# Patient Record
Sex: Female | Born: 1945 | Race: White | Hispanic: No | Marital: Married
Health system: Southern US, Community
[De-identification: ages and names within clinical notes are randomized; demographics above are authoritative.]

## PROBLEM LIST (undated history)

## (undated) DIAGNOSIS — G2111 Neuroleptic induced parkinsonism: Secondary | ICD-10-CM

## (undated) DIAGNOSIS — E039 Hypothyroidism, unspecified: Secondary | ICD-10-CM

## (undated) DIAGNOSIS — F201 Disorganized schizophrenia: Secondary | ICD-10-CM

## (undated) DIAGNOSIS — M810 Age-related osteoporosis without current pathological fracture: Secondary | ICD-10-CM

## (undated) DIAGNOSIS — F329 Major depressive disorder, single episode, unspecified: Secondary | ICD-10-CM

## (undated) DIAGNOSIS — G259 Extrapyramidal and movement disorder, unspecified: Secondary | ICD-10-CM

## (undated) DIAGNOSIS — R627 Adult failure to thrive: Secondary | ICD-10-CM

## (undated) DIAGNOSIS — I10 Essential (primary) hypertension: Secondary | ICD-10-CM

## (undated) DIAGNOSIS — F509 Eating disorder, unspecified: Secondary | ICD-10-CM

## (undated) DIAGNOSIS — F419 Anxiety disorder, unspecified: Secondary | ICD-10-CM

## (undated) DIAGNOSIS — K219 Gastro-esophageal reflux disease without esophagitis: Secondary | ICD-10-CM

## (undated) DIAGNOSIS — D649 Anemia, unspecified: Secondary | ICD-10-CM

## (undated) DIAGNOSIS — G4700 Insomnia, unspecified: Secondary | ICD-10-CM

## (undated) DIAGNOSIS — E785 Hyperlipidemia, unspecified: Secondary | ICD-10-CM

---

## 2005-11-23 ENCOUNTER — Inpatient Hospital Stay: Payer: Self-pay | Admitting: Internal Medicine

## 2005-11-23 ENCOUNTER — Other Ambulatory Visit: Payer: Self-pay

## 2005-12-26 ENCOUNTER — Other Ambulatory Visit: Payer: Self-pay

## 2005-12-26 ENCOUNTER — Inpatient Hospital Stay: Payer: Self-pay | Admitting: Internal Medicine

## 2007-09-30 ENCOUNTER — Other Ambulatory Visit: Payer: Self-pay

## 2007-09-30 ENCOUNTER — Observation Stay: Payer: Self-pay | Admitting: Internal Medicine

## 2008-02-16 IMAGING — CR DG LUMBAR SPINE 2-3V
1 series · 3 of 3 positions shown · non-contrast
Comparison: none

REASON FOR EXAM: Fall, back pain//Rm19
COMMENTS:

PROCEDURE:     DXR - DXR LUMBAR SPINE AP AND LATERAL  - November 23, 2005 [DATE]
RESULT:     The study demonstrates the loss of height within L2 suggestive
of a compression fracture which is indeterminate in age.  There is no
subluxation.  The alignment is otherwise unremarkable.

[Series 1: view not recorded · 0.17mm/px · 3 of 3 slices shown]
[im 1/3]
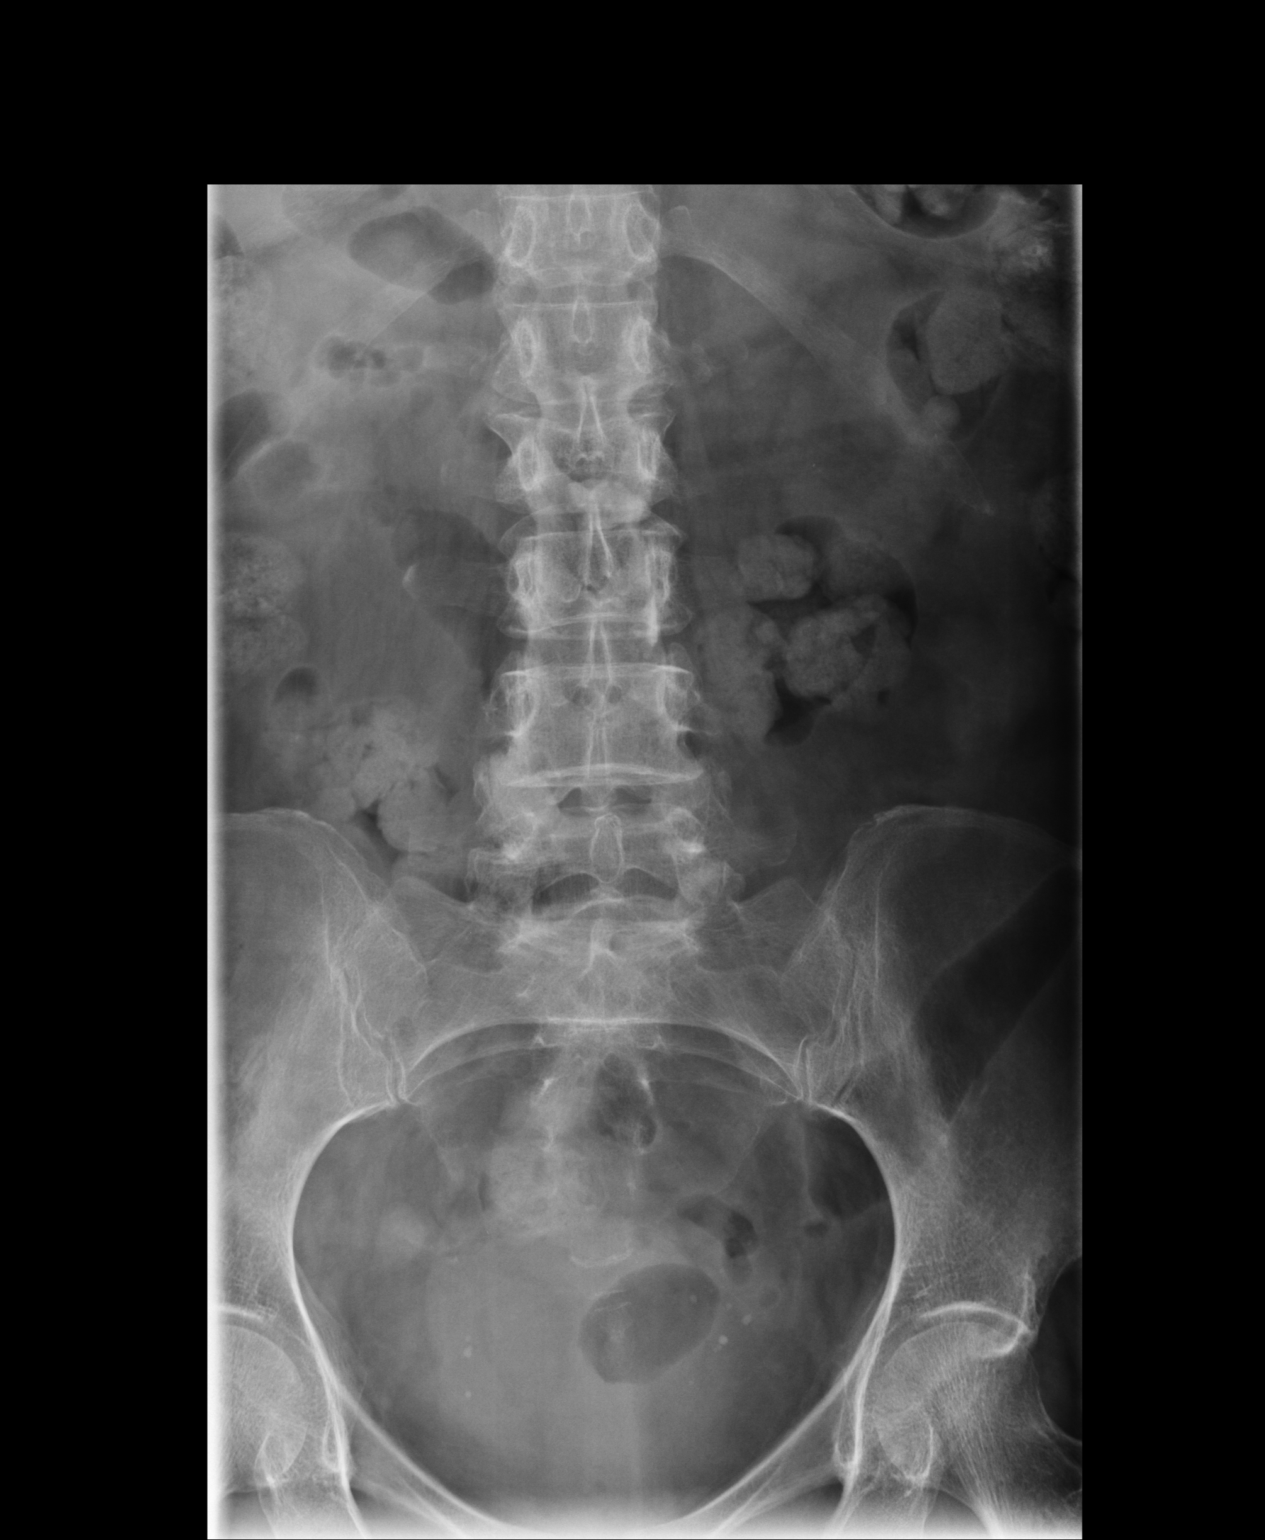
[im 2/3]
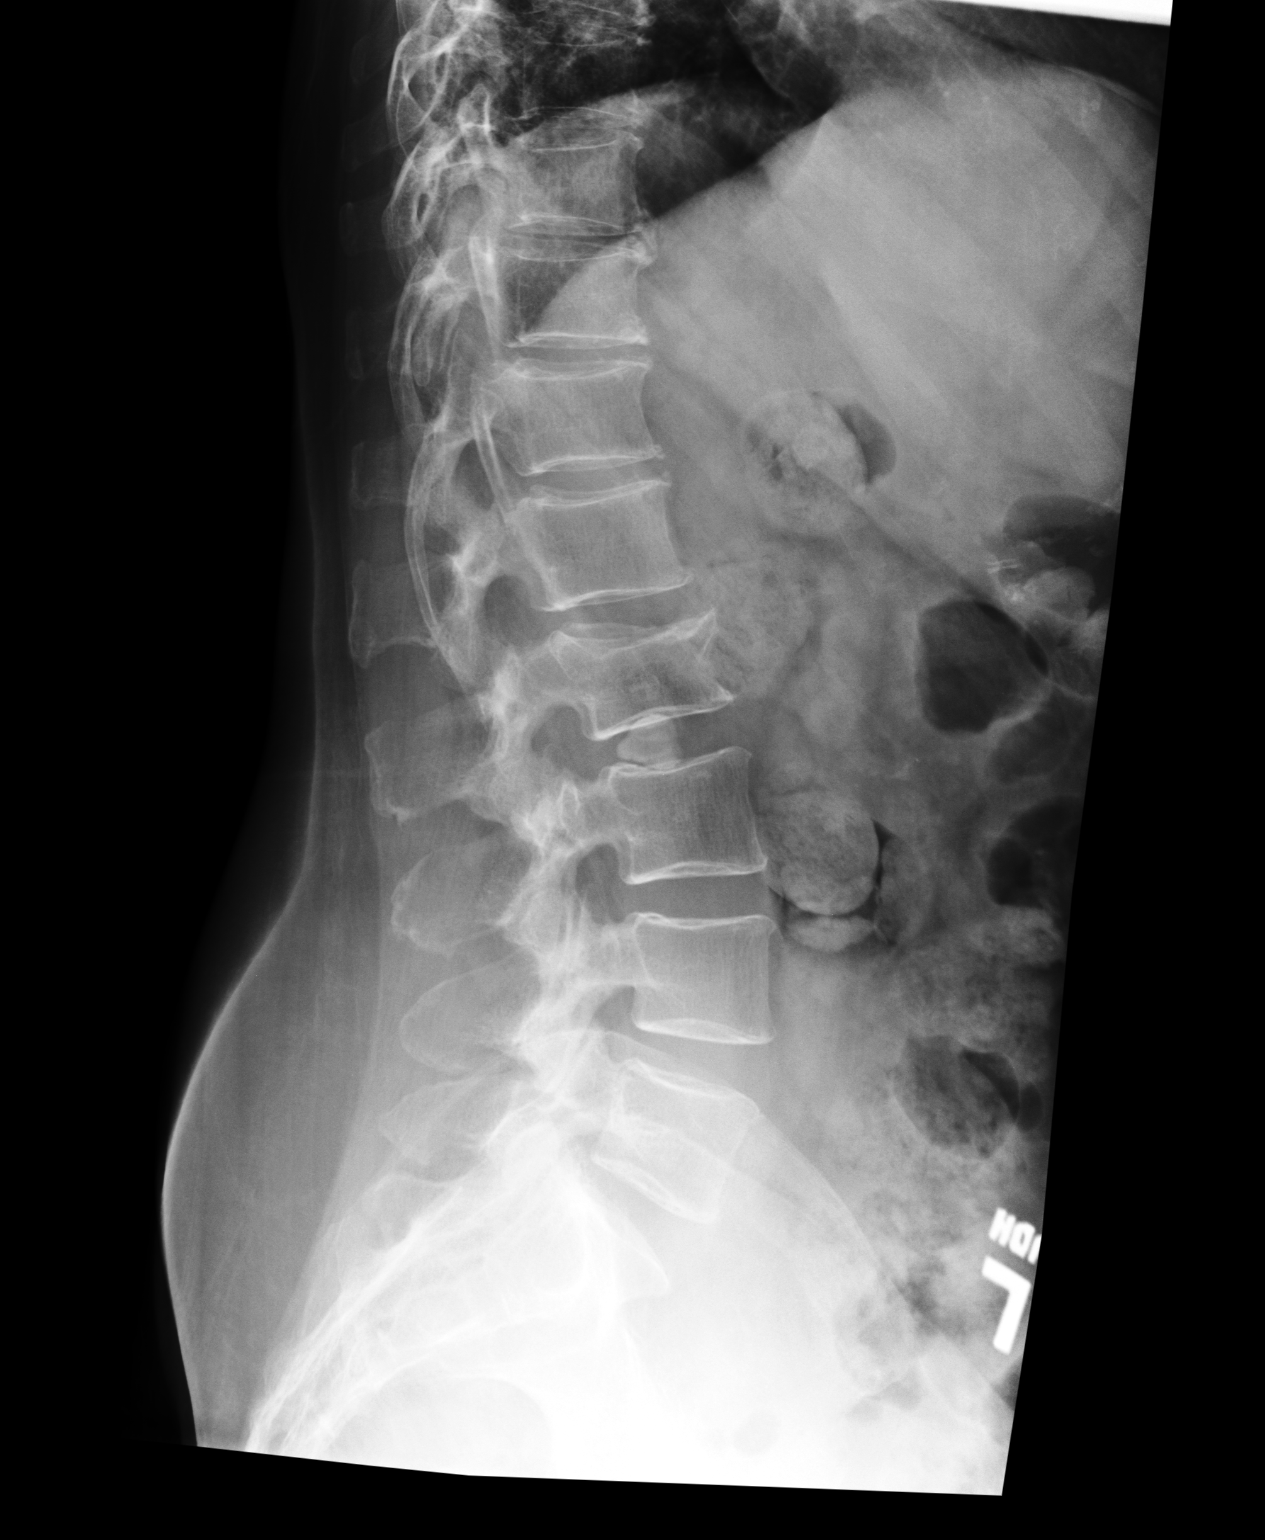
[im 3/3]
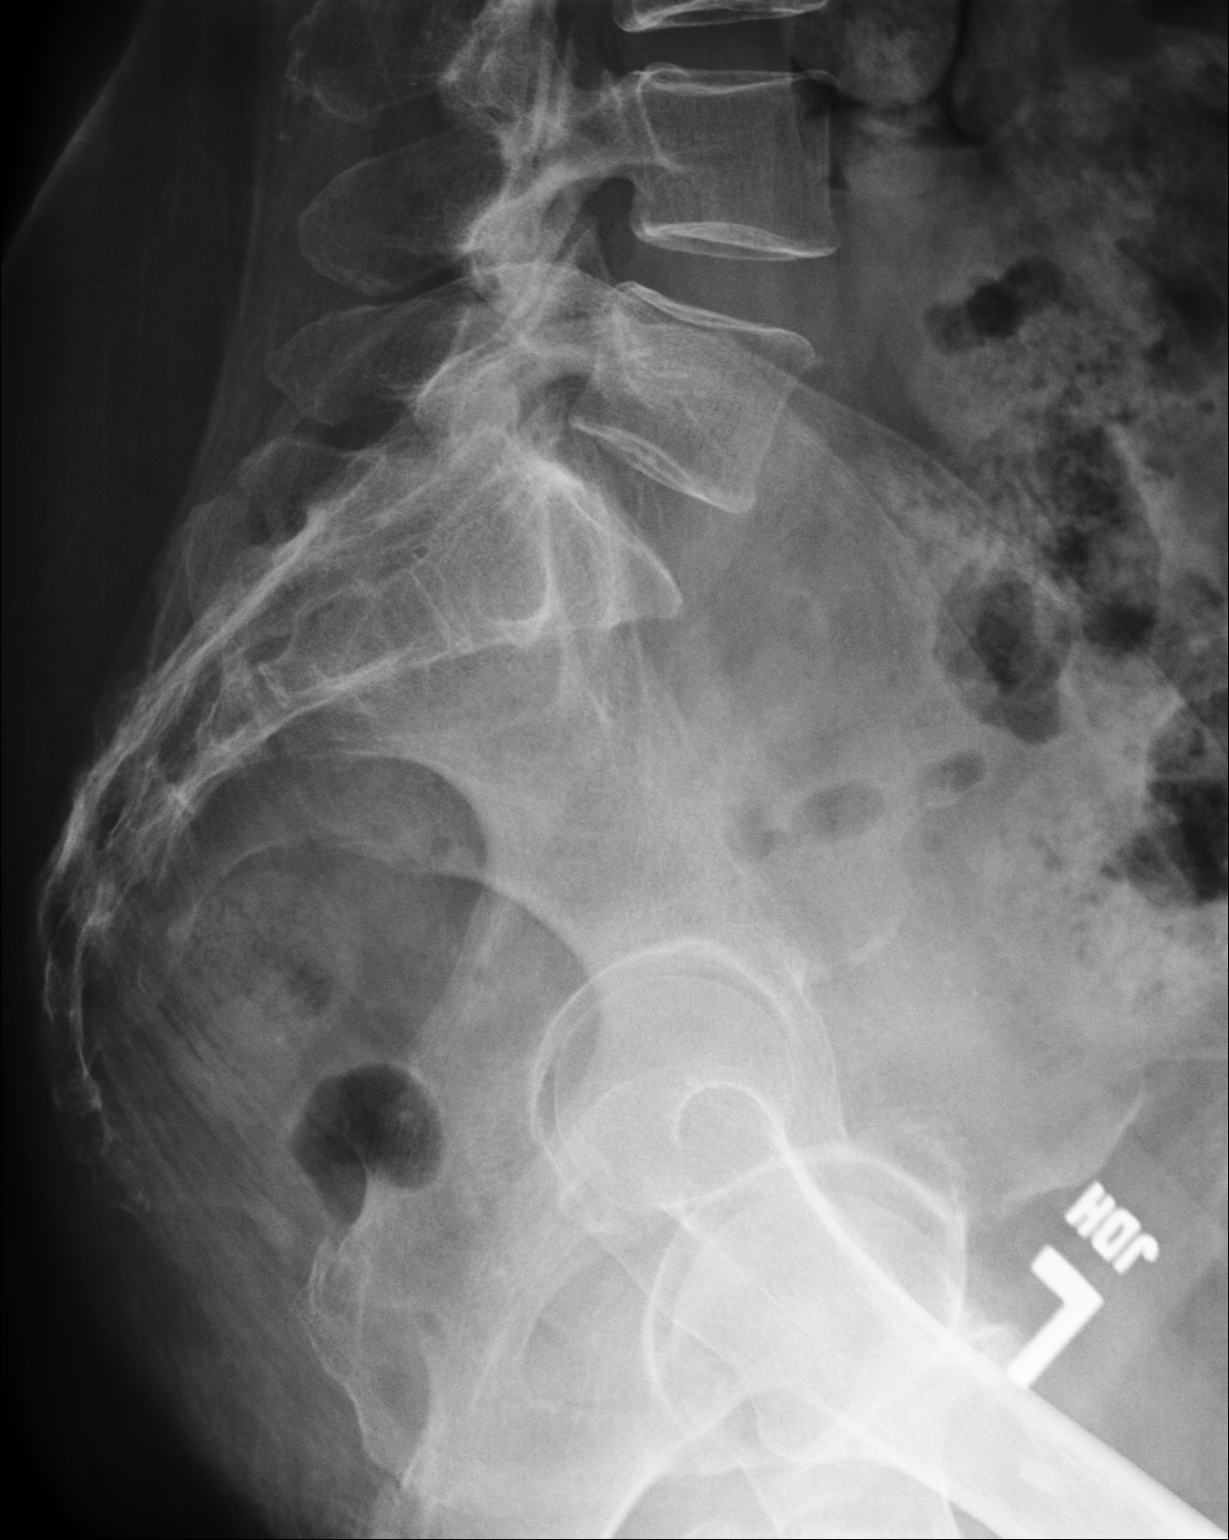

[3 of 3 positions shown; findings below may reference images not displayed]

IMPRESSION: Changes of compression fracture involving L2.  MRI could be performed to
evaluate for chronicity.

## 2008-02-16 IMAGING — CT CT HEAD WITHOUT CONTRAST
2 series · 16 of 30 positions shown, 20 images · non-contrast
Comparison: none

REASON FOR EXAM: weakness//Rm19
COMMENTS:

[Series 2: without · axial · non-contrast · 0.42mm/px · z∈[+334,+460]mm · 13 of 31 slices shown, 17 images]
[im 3/31  brain]
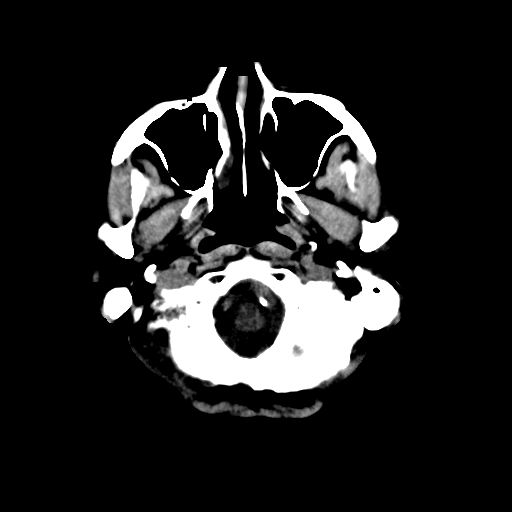
[im 3/31  bone]
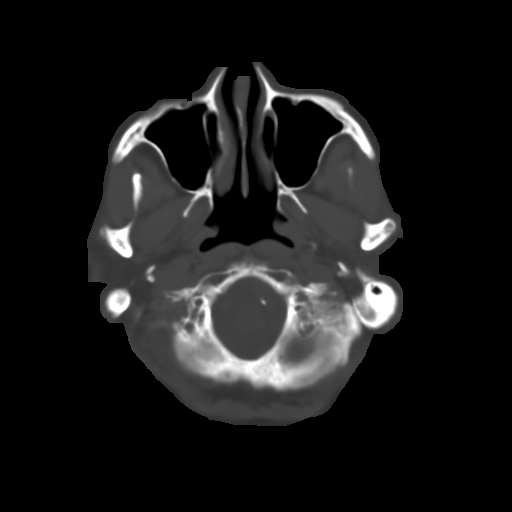
[im 5/31  brain]
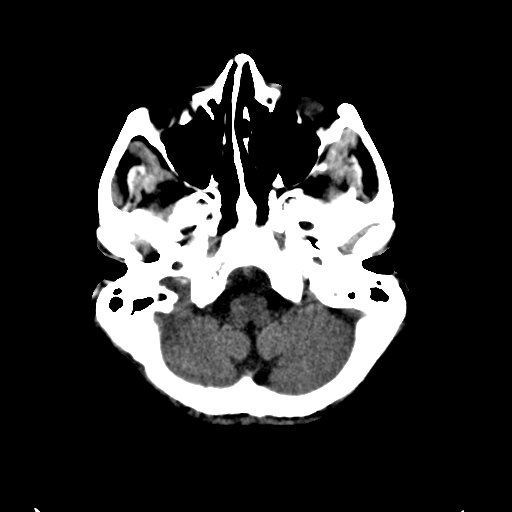
[im 7/31  brain]
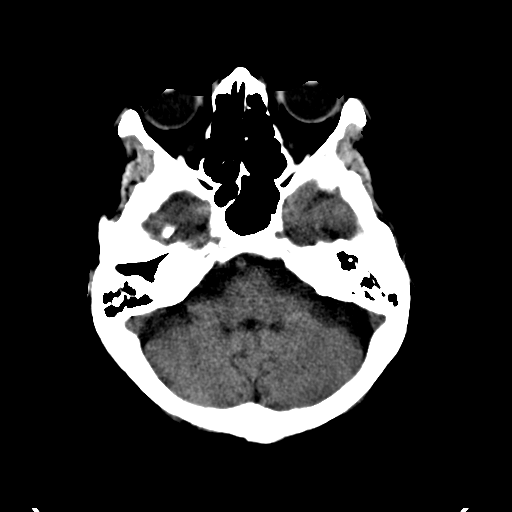
[im 9/31  brain]
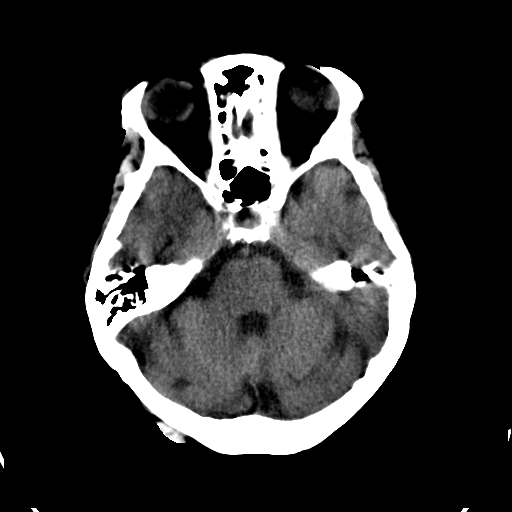
[im 11/31  brain]
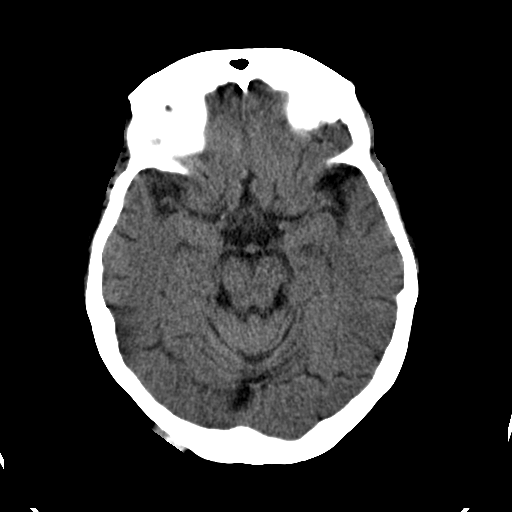
[im 11/31  bone]
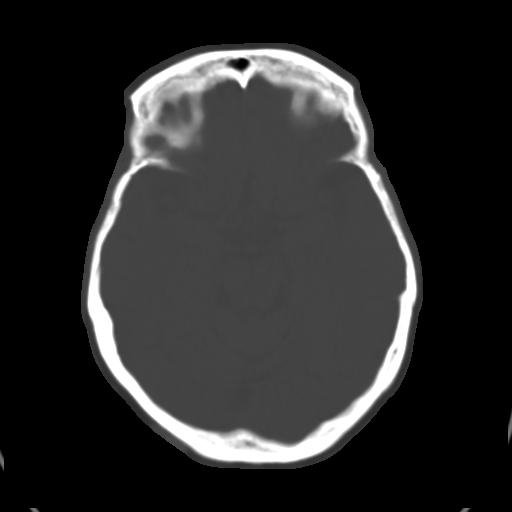
[im 13/31  brain]
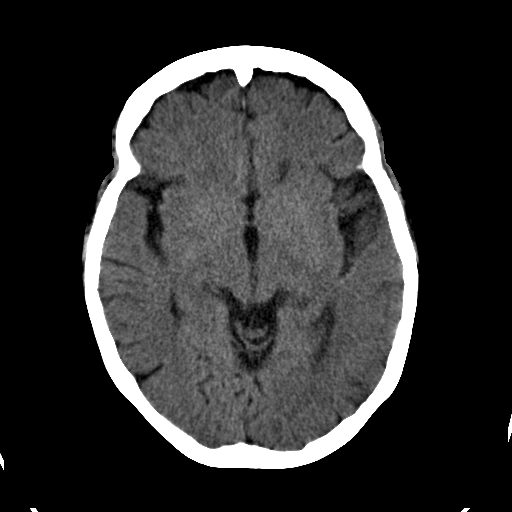
[im 16/31  brain]
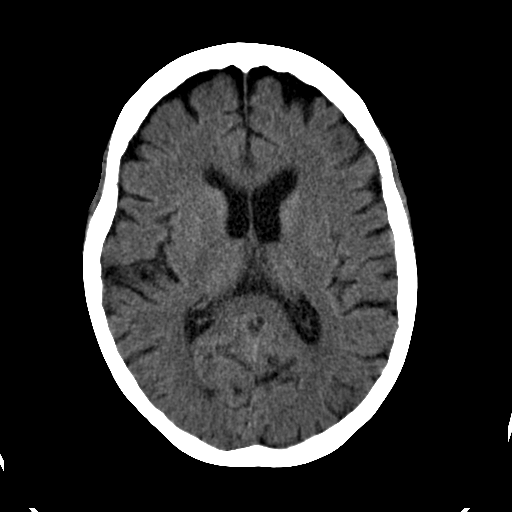
[im 18/31  brain]
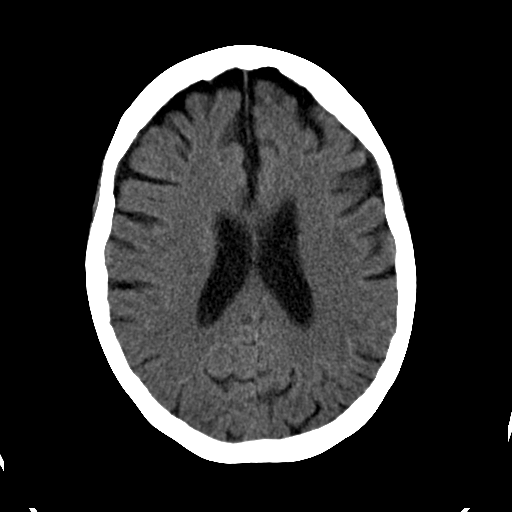
[im 20/31  brain]
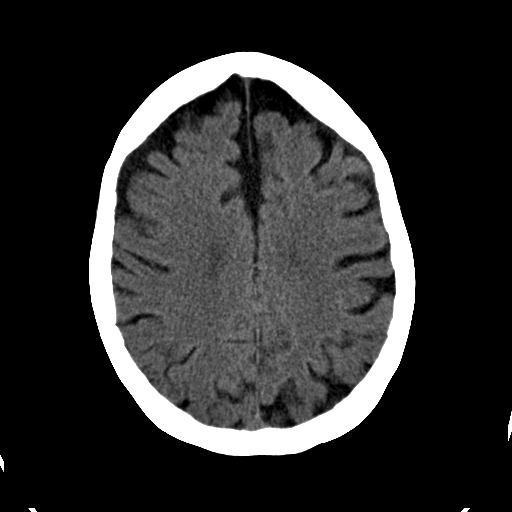
[im 20/31  bone]
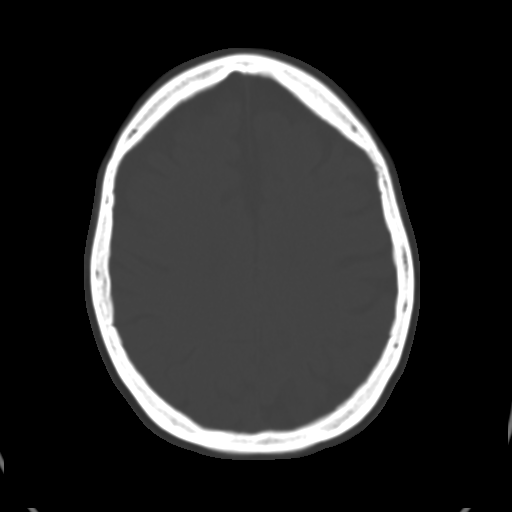
[im 22/31  brain]
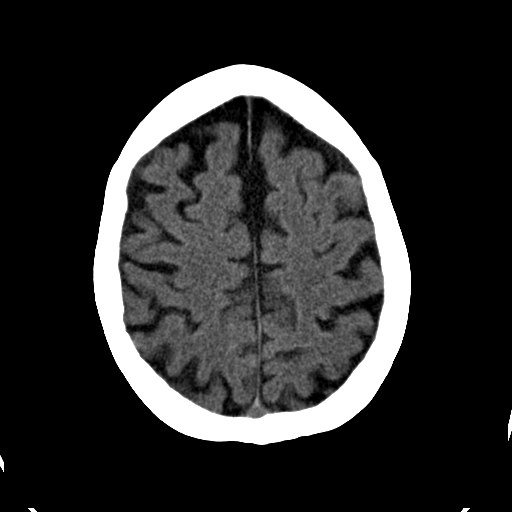
[im 24/31  brain]
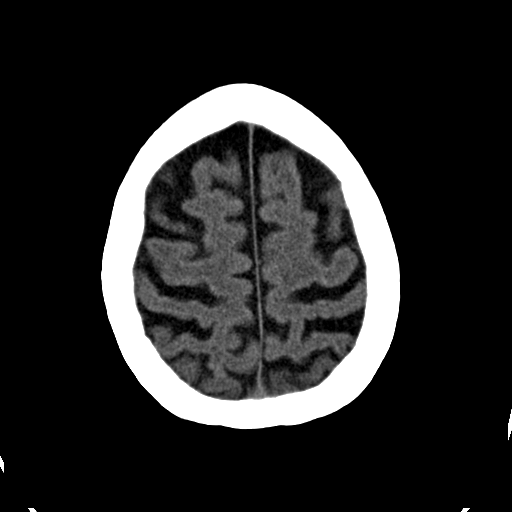
[im 26/31  brain]
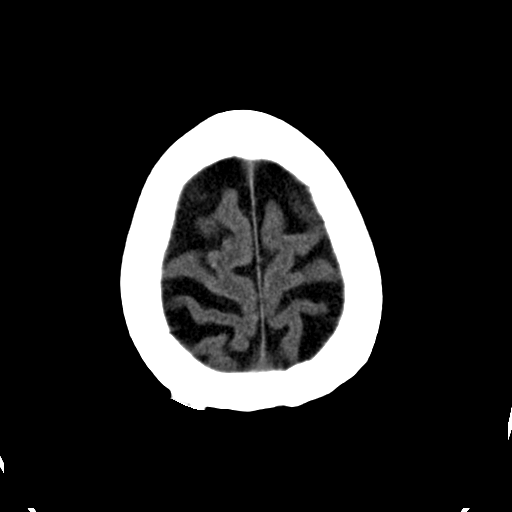
[im 28/31  brain]
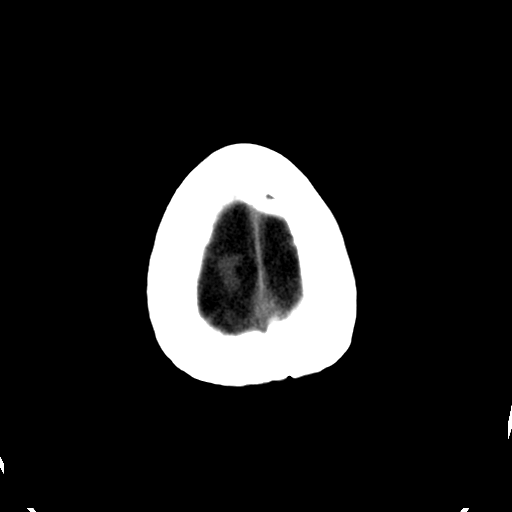
[im 28/31  bone]
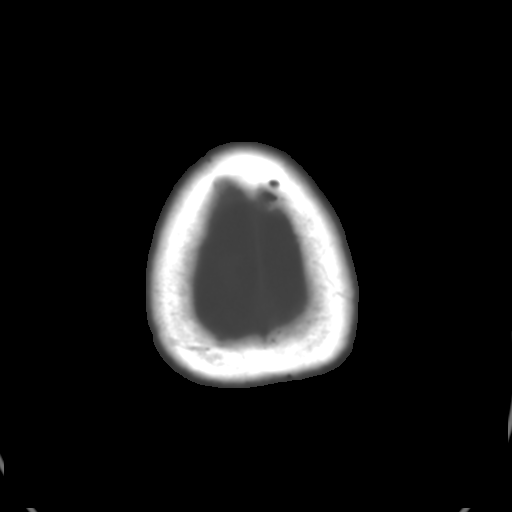

[Series 3: bone · axial · 0.42mm/px · z∈[+334,+374]mm · 3 of 31 slices shown]
[im 3/31  bone]
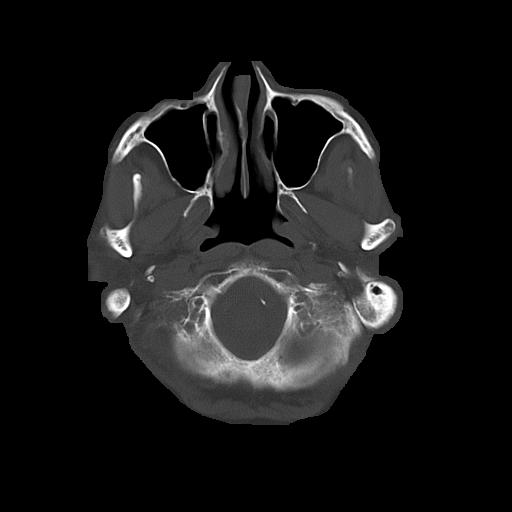
[im 7/31  bone]
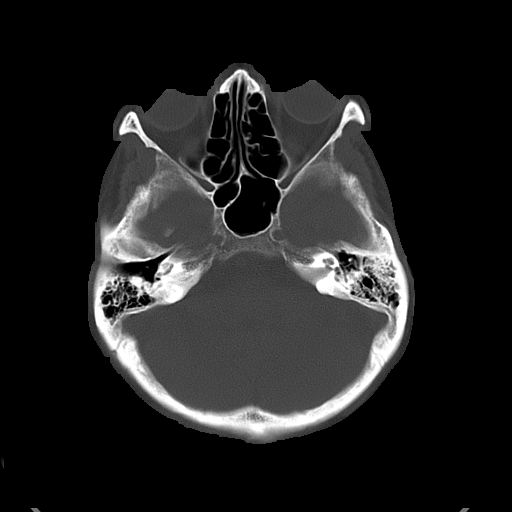
[im 11/31  bone]
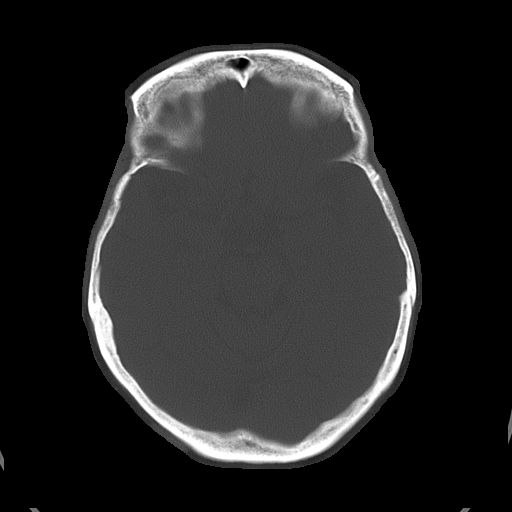

[16 of 30 positions shown; findings below may reference images not displayed]

PROCEDURE:     CT  - CT HEAD WITHOUT CONTRAST  - November 23, 2005 [DATE]

RESULT:     Noncontrast emergent CT of the brain shows prominence of the
ventricles and sulci consistent with diffuse atrophy.  There is no evidence
of hemorrhage.  There is no mass effect or midline shift.  No large
territorial infarct is seen.  There is no extraaxial hematoma.  There is a
tiny low density area adjacent to the LEFT lateral ventricle consistent with
an old lacunar infarct in the LEFT parietal periventricular white matter.
Bone window images show that the calvaria is intact.  The visualized
paranasal sinuses are well aerated as are the mastoid air cells.
IMPRESSION: Atrophy with an old tiny periventricular lacunar infarct in the LEFT
parietal region.  No acute abnormalities identified.  Findings were called
to the emergency room at the conclusion of the study.

## 2008-02-17 IMAGING — US US CAROTID DUPLEX BILAT
1 series · 17 of 24 positions shown · non-contrast
Comparison: none

REASON FOR EXAM: History of CVA
COMMENTS:

[Series 1: us carotid duplex bilat · 17 of 59 slices shown]
[im 1/59]
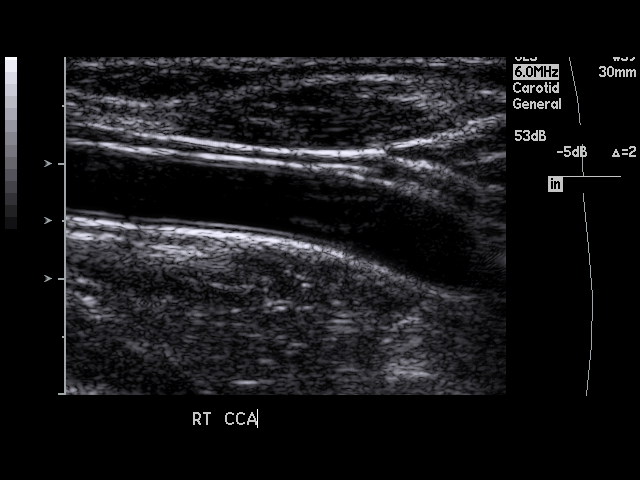
[im 6/59]
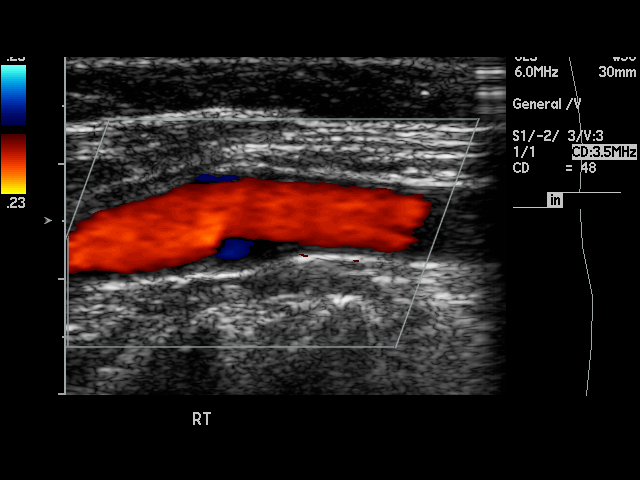
[im 8/59]
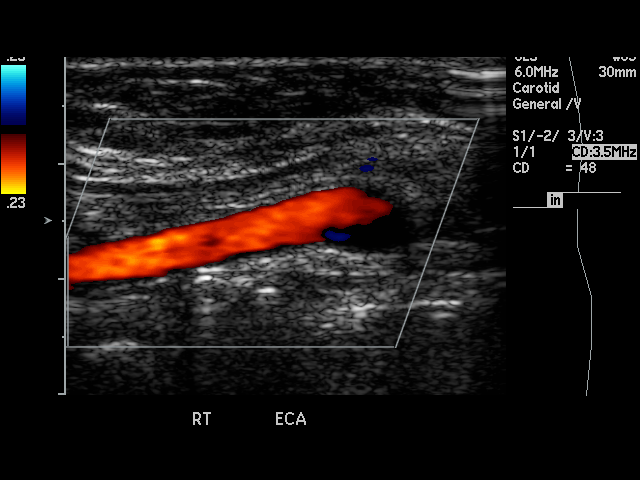
[im 11/59]
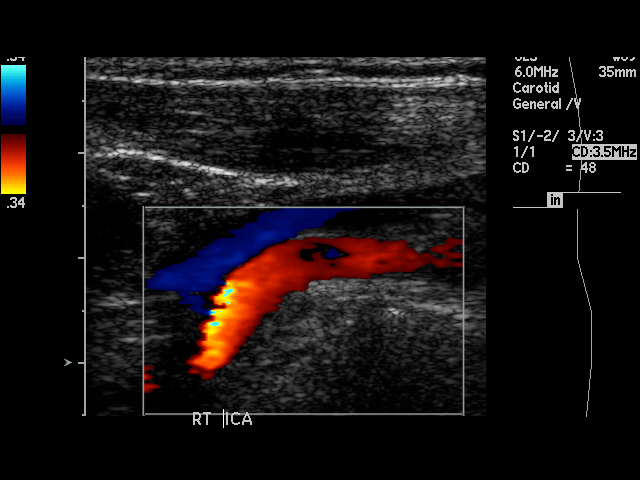
[im 16/59]
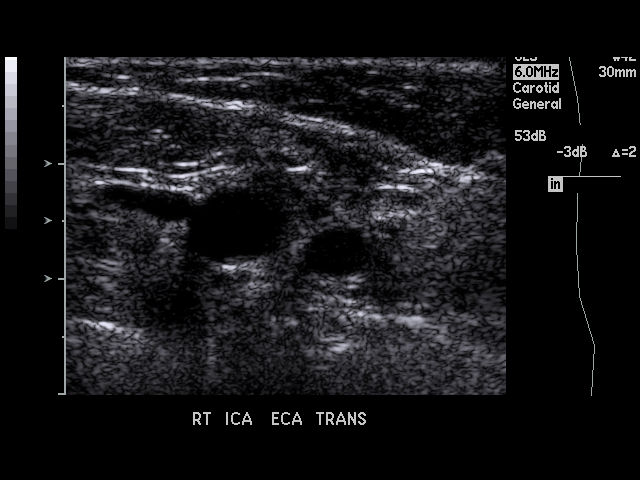
[im 18/59]
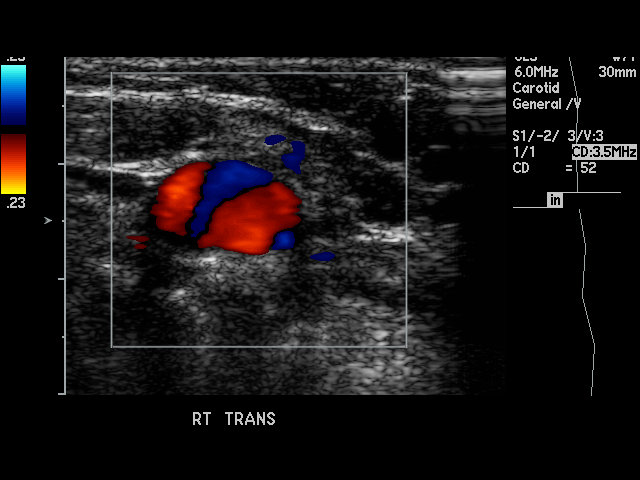
[im 23/59]
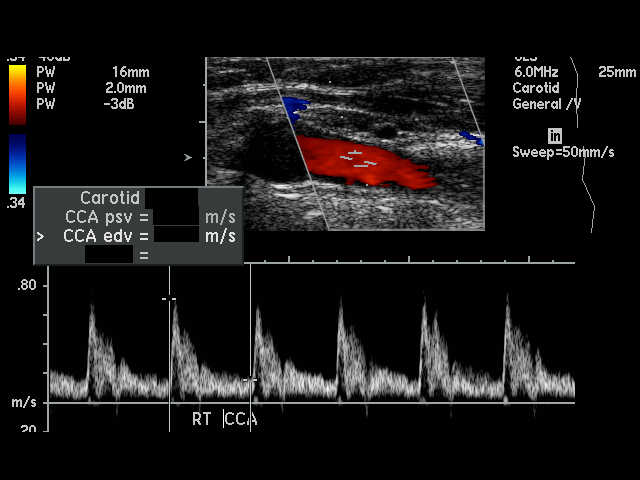
[im 26/59]
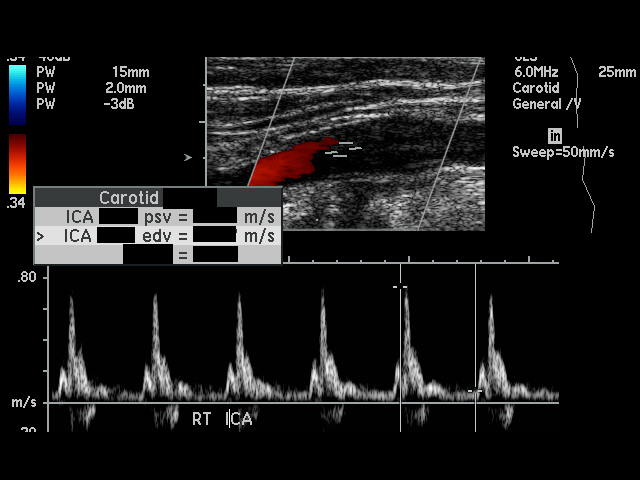
[im 31/59]
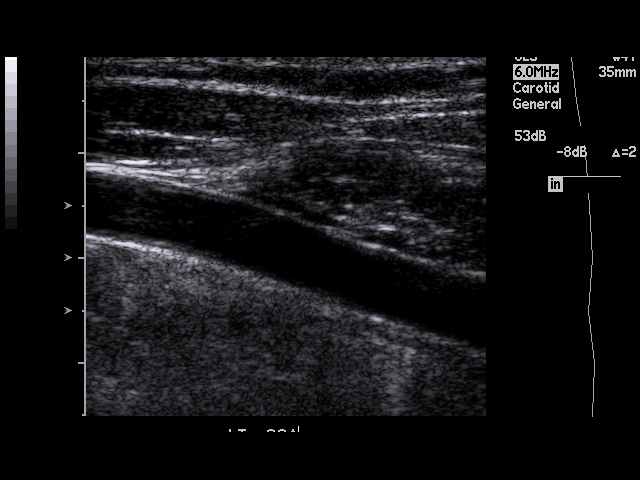
[im 33/59]
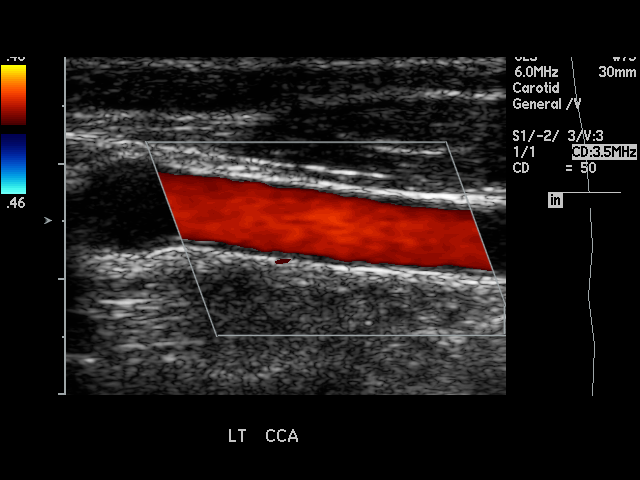
[im 36/59]
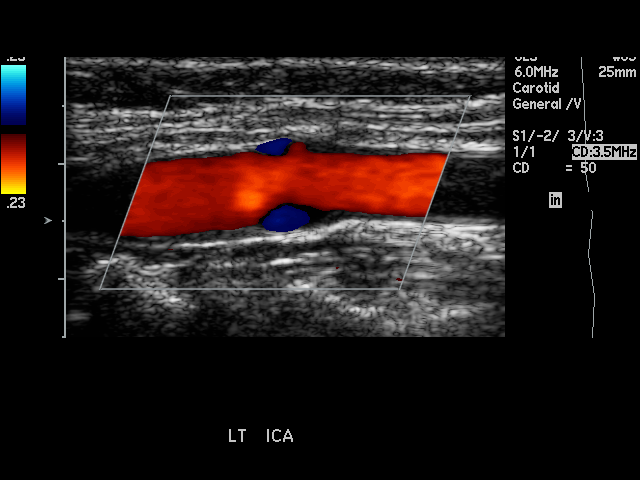
[im 41/59]
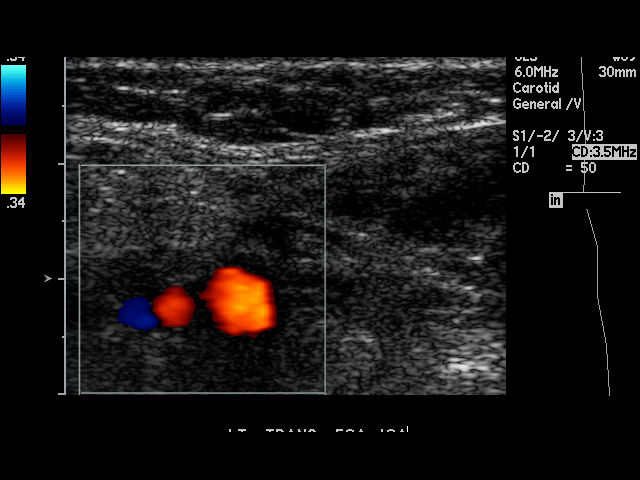
[im 43/59]
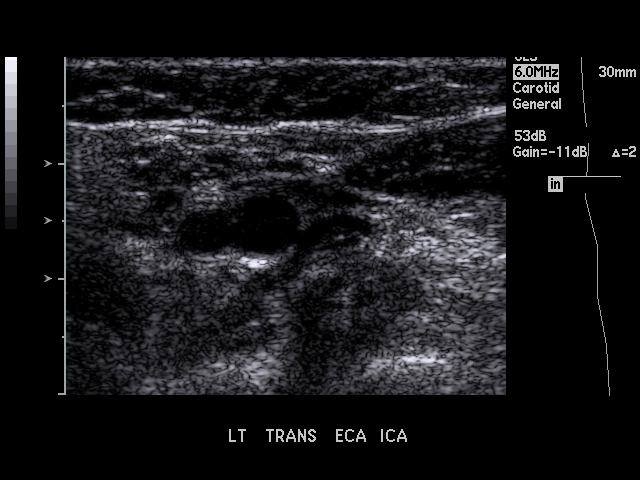
[im 48/59]
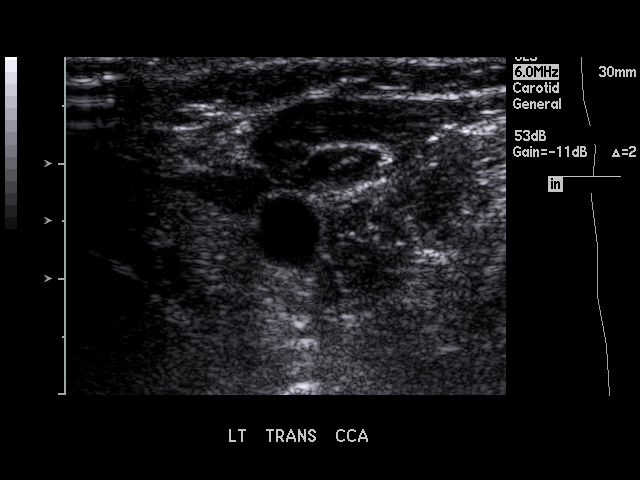
[im 51/59]
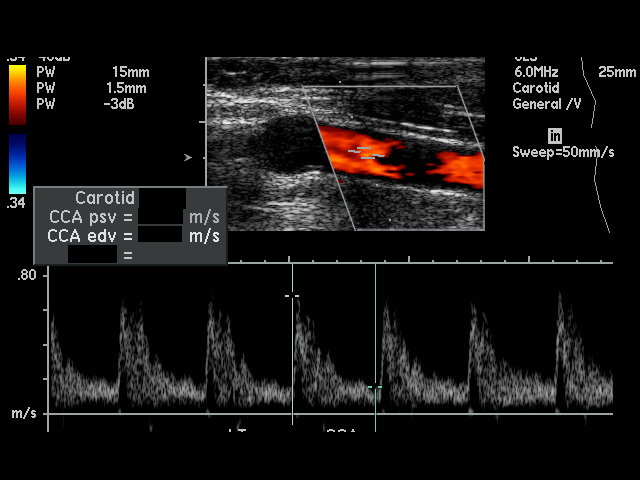
[im 53/59]
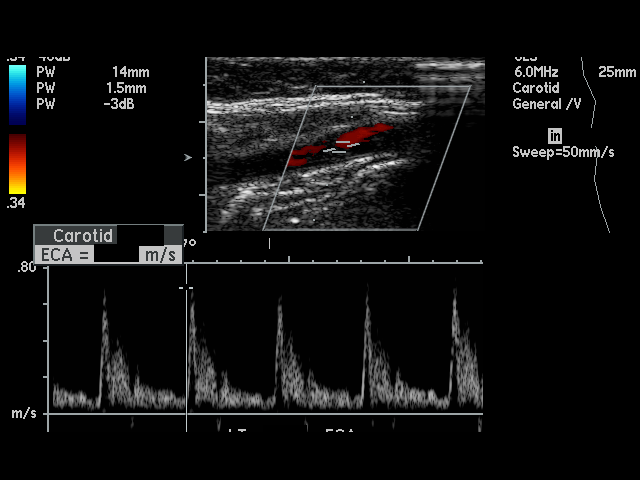
[im 59/59]
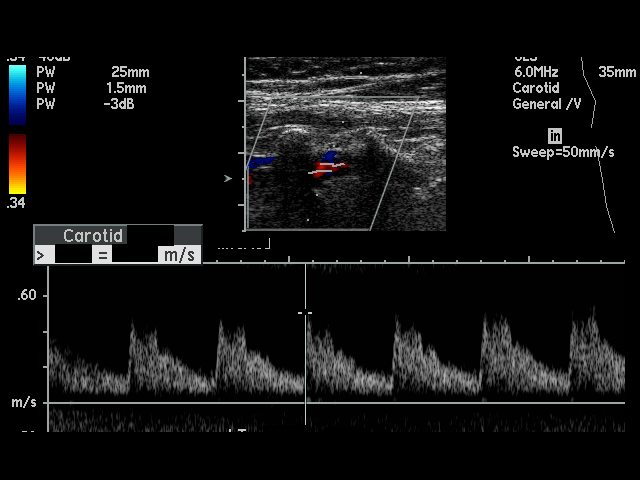

[17 of 24 positions shown; findings below may reference images not displayed]

PROCEDURE:     US  - US CAROTID DOPPLER BILATERAL  - November 24, 2005 [DATE]

RESULT:     There is noted slight plaque formation at the carotid
bifurcations bilaterally.  On the RIGHT peak RIGHT common carotid artery
flow velocity measures .874 meters per second and the peak RIGHT internal
carotid artery flow velocity measures .748 meters per second.  ICA over CCA
ratio is 0.856.  On the LEFT the peak LEFT common carotid artery flow
velocity measures 0.819 meters per second and the peak LEFT internal carotid
artery flow velocity measures 0.771 meters per second.  ICA over CCA ratio
is 0.941.  These values bilaterally are compatible with the absence of
hemodynamically significant stenosis.

Antegrade flow is present in both vertebrals.
IMPRESSION: Slight plaque formation is noted at the carotid bifurcations bilaterally.

No hemodynamically significant stenosis is identified on either side.

Antegrade flow is present in both vertebrals.

## 2009-06-01 ENCOUNTER — Inpatient Hospital Stay: Payer: Self-pay | Admitting: Internal Medicine

## 2010-10-25 ENCOUNTER — Inpatient Hospital Stay: Payer: Self-pay | Admitting: Internal Medicine

## 2010-11-11 ENCOUNTER — Emergency Department: Payer: Self-pay

## 2011-02-06 ENCOUNTER — Inpatient Hospital Stay: Payer: Self-pay | Admitting: Internal Medicine

## 2012-04-21 ENCOUNTER — Ambulatory Visit: Payer: Self-pay | Admitting: Internal Medicine

## 2012-04-21 LAB — CBC WITH DIFFERENTIAL/PLATELET
Basophil %: 0.6 %
Eosinophil #: 0 10*3/uL (ref 0.0–0.7)
Eosinophil %: 0.3 %
HCT: 33.6 % — ABNORMAL LOW (ref 35.0–47.0)
Lymphocyte #: 4.2 10*3/uL — ABNORMAL HIGH (ref 1.0–3.6)
Lymphocyte %: 59.2 %
MCV: 92 fL (ref 80–100)
Monocyte #: 0.6 x10 3/mm (ref 0.2–0.9)
Neutrophil #: 2.2 10*3/uL (ref 1.4–6.5)
Platelet: 124 10*3/uL — ABNORMAL LOW (ref 150–440)
RBC: 3.66 10*6/uL — ABNORMAL LOW (ref 3.80–5.20)
RDW: 13.4 % (ref 11.5–14.5)
WBC: 7.1 10*3/uL (ref 3.6–11.0)

## 2013-07-24 ENCOUNTER — Ambulatory Visit: Payer: Self-pay | Admitting: Nurse Practitioner

## 2016-02-27 DIAGNOSIS — Z515 Encounter for palliative care: Secondary | ICD-10-CM | POA: Diagnosis not present

## 2016-02-27 DIAGNOSIS — Z66 Do not resuscitate: Secondary | ICD-10-CM | POA: Diagnosis not present

## 2016-02-27 DIAGNOSIS — F0390 Unspecified dementia without behavioral disturbance: Secondary | ICD-10-CM | POA: Diagnosis not present

## 2016-02-27 DIAGNOSIS — I2510 Atherosclerotic heart disease of native coronary artery without angina pectoris: Secondary | ICD-10-CM | POA: Diagnosis not present

## 2016-02-27 DIAGNOSIS — R627 Adult failure to thrive: Secondary | ICD-10-CM | POA: Diagnosis not present

## 2016-07-18 ENCOUNTER — Emergency Department
Admission: EM | Admit: 2016-07-18 | Discharge: 2016-07-24 | Disposition: E | Payer: Medicare Other | Attending: Emergency Medicine | Admitting: Emergency Medicine

## 2016-07-18 ENCOUNTER — Encounter: Payer: Self-pay | Admitting: Intensive Care

## 2016-07-18 ENCOUNTER — Emergency Department: Payer: Medicare Other

## 2016-07-18 DIAGNOSIS — I10 Essential (primary) hypertension: Secondary | ICD-10-CM | POA: Insufficient documentation

## 2016-07-18 DIAGNOSIS — R410 Disorientation, unspecified: Secondary | ICD-10-CM

## 2016-07-18 DIAGNOSIS — J9601 Acute respiratory failure with hypoxia: Secondary | ICD-10-CM | POA: Insufficient documentation

## 2016-07-18 DIAGNOSIS — J168 Pneumonia due to other specified infectious organisms: Secondary | ICD-10-CM | POA: Diagnosis not present

## 2016-07-18 DIAGNOSIS — E039 Hypothyroidism, unspecified: Secondary | ICD-10-CM | POA: Insufficient documentation

## 2016-07-18 DIAGNOSIS — R6520 Severe sepsis without septic shock: Secondary | ICD-10-CM | POA: Diagnosis not present

## 2016-07-18 DIAGNOSIS — A419 Sepsis, unspecified organism: Secondary | ICD-10-CM | POA: Insufficient documentation

## 2016-07-18 DIAGNOSIS — R4182 Altered mental status, unspecified: Secondary | ICD-10-CM | POA: Diagnosis present

## 2016-07-18 DIAGNOSIS — J189 Pneumonia, unspecified organism: Secondary | ICD-10-CM

## 2016-07-18 HISTORY — DX: Anemia, unspecified: D64.9

## 2016-07-18 HISTORY — DX: Adult failure to thrive: R62.7

## 2016-07-18 HISTORY — DX: Essential (primary) hypertension: I10

## 2016-07-18 HISTORY — DX: Age-related osteoporosis without current pathological fracture: M81.0

## 2016-07-18 HISTORY — DX: Major depressive disorder, single episode, unspecified: F32.9

## 2016-07-18 HISTORY — DX: Extrapyramidal and movement disorder, unspecified: G25.9

## 2016-07-18 HISTORY — DX: Disorganized schizophrenia: F20.1

## 2016-07-18 HISTORY — DX: Gastro-esophageal reflux disease without esophagitis: K21.9

## 2016-07-18 HISTORY — DX: Insomnia, unspecified: G47.00

## 2016-07-18 HISTORY — DX: Hypothyroidism, unspecified: E03.9

## 2016-07-18 HISTORY — DX: Anxiety disorder, unspecified: F41.9

## 2016-07-18 HISTORY — DX: Hyperlipidemia, unspecified: E78.5

## 2016-07-18 HISTORY — DX: Eating disorder, unspecified: F50.9

## 2016-07-18 HISTORY — DX: Neuroleptic induced parkinsonism: G21.11

## 2016-07-18 LAB — BLOOD GAS, VENOUS
ACID-BASE DEFICIT: 0.4 mmol/L (ref 0.0–2.0)
Bicarbonate: 27.1 mmol/L (ref 20.0–28.0)
FIO2: 1
O2 SAT: 86.5 %
PCO2 VEN: 55 mmHg (ref 44.0–60.0)
PO2 VEN: 58 mmHg — AB (ref 32.0–45.0)
Patient temperature: 37
pH, Ven: 7.3 (ref 7.250–7.430)

## 2016-07-18 LAB — PHOSPHORUS: Phosphorus: 6.4 mg/dL — ABNORMAL HIGH (ref 2.5–4.6)

## 2016-07-18 LAB — COMPREHENSIVE METABOLIC PANEL
ALBUMIN: 2.7 g/dL — AB (ref 3.5–5.0)
ALK PHOS: 107 U/L (ref 38–126)
ALT: 61 U/L — AB (ref 14–54)
ANION GAP: 19 — AB (ref 5–15)
AST: 120 U/L — AB (ref 15–41)
BILIRUBIN TOTAL: 0.7 mg/dL (ref 0.3–1.2)
BUN: 96 mg/dL — ABNORMAL HIGH (ref 6–20)
CALCIUM: 10.8 mg/dL — AB (ref 8.9–10.3)
CO2: 23 mmol/L (ref 22–32)
CREATININE: 3.17 mg/dL — AB (ref 0.44–1.00)
Chloride: 90 mmol/L — ABNORMAL LOW (ref 101–111)
GFR, EST AFRICAN AMERICAN: 16 mL/min — AB (ref >60)
GFR, EST NON AFRICAN AMERICAN: 14 mL/min — AB (ref >60)
Glucose, Bld: 132 mg/dL — ABNORMAL HIGH (ref 65–99)
Potassium: 5.1 mmol/L (ref 3.5–5.1)
Sodium: 132 mmol/L — ABNORMAL LOW (ref 135–145)
TOTAL PROTEIN: 6.4 g/dL — AB (ref 6.5–8.1)

## 2016-07-18 LAB — CBC WITH DIFFERENTIAL/PLATELET
BAND NEUTROPHILS: 2 %
BASOS ABS: 0 10*3/uL (ref 0–0.1)
BLASTS: 0 %
Basophils Relative: 0 %
EOS ABS: 0.1 10*3/uL (ref 0–0.7)
Eosinophils Relative: 2 %
HEMATOCRIT: 30.8 % — AB (ref 35.0–47.0)
Hemoglobin: 10.2 g/dL — ABNORMAL LOW (ref 12.0–16.0)
Lymphocytes Relative: 38 %
Lymphs Abs: 1.7 10*3/uL (ref 1.0–3.6)
MCH: 31.9 pg (ref 26.0–34.0)
MCHC: 33.2 g/dL (ref 32.0–36.0)
MCV: 96.2 fL (ref 80.0–100.0)
METAMYELOCYTES PCT: 2 %
MONOS PCT: 8 %
MYELOCYTES: 0 %
Monocytes Absolute: 0.4 10*3/uL (ref 0.2–0.9)
Neutro Abs: 2.3 10*3/uL (ref 1.4–6.5)
Neutrophils Relative %: 48 %
Other: 0 %
PLATELETS: 243 10*3/uL (ref 150–440)
Promyelocytes Absolute: 0 %
RBC: 3.2 MIL/uL — ABNORMAL LOW (ref 3.80–5.20)
RDW: 15.3 % — ABNORMAL HIGH (ref 11.5–14.5)
WBC: 4.5 10*3/uL (ref 3.6–11.0)
nRBC: 0 /100 WBC

## 2016-07-18 LAB — PROCALCITONIN: Procalcitonin: 0.57 ng/mL

## 2016-07-18 LAB — PROTIME-INR
INR: 1.26
PROTHROMBIN TIME: 15.9 s — AB (ref 11.4–15.2)

## 2016-07-18 LAB — LIPASE, BLOOD: LIPASE: 45 U/L (ref 11–51)

## 2016-07-18 LAB — INFLUENZA PANEL BY PCR (TYPE A & B)
Influenza A By PCR: NEGATIVE
Influenza B By PCR: NEGATIVE

## 2016-07-18 LAB — LACTIC ACID, PLASMA
LACTIC ACID, VENOUS: 5.8 mmol/L — AB (ref 0.5–1.9)
LACTIC ACID, VENOUS: 4.7 mmol/L — AB (ref 0.5–1.9)

## 2016-07-18 LAB — APTT: aPTT: 30 seconds (ref 24–36)

## 2016-07-18 LAB — TROPONIN I: TROPONIN I: 0.04 ng/mL — AB (ref <0.03)

## 2016-07-18 MED ORDER — LORAZEPAM 2 MG/ML IJ SOLN: 2.0000 mg | INTRAMUSCULAR | Status: DC | PRN

## 2016-07-18 MED ORDER — SODIUM CHLORIDE 0.9 % IV BOLUS (SEPSIS)
250.0000 mL | Freq: Once | INTRAVENOUS | Status: AC
  Administered 2016-07-18: 250 mL via INTRAVENOUS

## 2016-07-18 MED ORDER — MORPHINE BOLUS VIA INFUSION
5.0000 mg | INTRAVENOUS | Status: DC | PRN
  Administered 2016-07-18: 5 mg via INTRAVENOUS
  Administered 2016-07-18: 20 mg via INTRAVENOUS
  Administered 2016-07-18: 15 mg via INTRAVENOUS
  Administered 2016-07-18: 10 mg via INTRAVENOUS
  Administered 2016-07-18: 20 mg via INTRAVENOUS
  Administered 2016-07-18: 5 mg via INTRAVENOUS
  Filled 2016-07-18: qty 20

## 2016-07-18 MED ORDER — VANCOMYCIN HCL IN DEXTROSE 750-5 MG/150ML-% IV SOLN
750.0000 mg | Freq: Once | INTRAVENOUS | Status: DC
  Filled 2016-07-18: qty 150

## 2016-07-18 MED ORDER — MORPHINE 100MG IN NS 100ML (1MG/ML) PREMIX INFUSION
10.0000 mg/h | INTRAVENOUS | Status: DC
  Administered 2016-07-18: 10 mg/h via INTRAVENOUS
  Filled 2016-07-18 (×2): qty 100

## 2016-07-18 MED ORDER — SODIUM CHLORIDE 0.9 % IV SOLN: 10.0000 mg/h | INTRAVENOUS | Status: DC

## 2016-07-18 MED ORDER — SODIUM CHLORIDE 0.9 % IV BOLUS (SEPSIS)
500.0000 mL | Freq: Once | INTRAVENOUS | Status: AC
  Administered 2016-07-18: 500 mL via INTRAVENOUS

## 2016-07-18 MED ORDER — PIPERACILLIN-TAZOBACTAM 3.375 G IVPB 30 MIN
3.3750 g | Freq: Once | INTRAVENOUS | Status: AC
  Administered 2016-07-18: 3.375 g via INTRAVENOUS

## 2016-07-18 MED ORDER — PIPERACILLIN-TAZOBACTAM 3.375 G IVPB: 3.3750 g | Freq: Two times a day (BID) | INTRAVENOUS | Status: DC

## 2016-07-18 MED ORDER — VANCOMYCIN HCL IN DEXTROSE 1-5 GM/200ML-% IV SOLN
1000.0000 mg | Freq: Once | INTRAVENOUS | Status: AC
  Administered 2016-07-18: 1000 mg via INTRAVENOUS

## 2016-07-18 MED ORDER — SODIUM CHLORIDE 0.9 % IV BOLUS (SEPSIS)
1000.0000 mL | Freq: Once | INTRAVENOUS | Status: AC
Start: 2016-07-18 — End: 2016-07-18
  Administered 2016-07-18: 1000 mL via INTRAVENOUS

## 2016-07-19 LAB — URINE CULTURE

## 2016-07-21 ENCOUNTER — Telehealth: Payer: Self-pay | Admitting: Internal Medicine

## 2016-07-21 NOTE — Telephone Encounter (Signed)
Death certificate placed in DK's folder to be signed.  

## 2016-07-21 NOTE — Telephone Encounter (Signed)
Received a death certificate from Ervin KnackJerry Coleman [facility].

## 2016-07-22 NOTE — Telephone Encounter (Signed)
Informed funeral home death cert ready for pick up. Nothing further needed. 

## 2016-07-23 LAB — CULTURE, BLOOD (ROUTINE X 2)
CULTURE: NO GROWTH
CULTURE: NO GROWTH

## 2016-07-24 DIAGNOSIS — 419620001 Death: Secondary | SNOMED CT | POA: Diagnosis not present

## 2016-07-24 NOTE — ED Notes (Signed)
Thelma from [facility] was informed of patient's passing and time of death.

## 2016-07-24 NOTE — ED Notes (Signed)
bair hugger placed on patient

## 2016-07-24 NOTE — Death Summary Note (Signed)
   DEATH SUMMARY   Patient Details  Name: Michelle Valencia MRN: [id number] DOB: [birth year]  Admission/Discharge Information   Admit Date:  Feb 06, 2017  Date of Death: Date of Death: Oct 01, 2016  Time of Death: Time of Death: 1559  Length of Stay: 0  Referring Physician: Derwood KaplanEason,  Ernest B, MD     Diagnoses  Preliminary cause of death:  Secondary Diagnoses (including complications and co-morbidities):  resp failure Pneumonia Septic shock Renal failure  Brief Hospital Course (including significant findings, care, treatment, and services provided and events leading to death)  Michelle Valencia is a 71 y.o. year old female who was seen in ER for multiorgan failure Family updated and prognosis is poor, patient was made comfort care after discussion with family.   Pertinent Labs and Studies  Significant Diagnostic Studies Dg Chest Port 1 View  Result Date: Feb 06, 2017 CLINICAL DATA:  Hypoxia, bilateral lung crackles, altered mental status EXAM: PORTABLE CHEST 1 VIEW COMPARISON:  02/06/2011 FINDINGS: There is streaky airspace opacification bilaterally highly suspicious for bilateral pneumonia or pulmonary edema. Cardiomediastinal silhouette is unremarkable. IMPRESSION: Bilateral streaky airspace opacification and interstitial prominence highly suspicious for bilateral pneumonia or pulmonary edema. Clinical correlation is necessary. Electronically Signed   By: Natasha MeadLiviu  Pop M.D.   On: 0Sep 14, 2018 10:33    Microbiology No results found for this or any previous visit (from the past 240 hour(s)).  Lab Basic Metabolic Panel:  Recent Labs Lab Oct 01, 2016 1014  NA 132*  K 5.1  CL 90*  CO2 23  GLUCOSE 132*  BUN 96*  CREATININE 3.17*  CALCIUM 10.8*  PHOS 6.4*   Liver Function Tests:  Recent Labs Lab Oct 01, 2016 1014  AST 120*  ALT 61*  ALKPHOS 107  BILITOT 0.7  PROT 6.4*  ALBUMIN 2.7*    Recent Labs Lab Oct 01, 2016 1014  LIPASE 45   No results for input(s): AMMONIA in the last 168  hours. CBC:  Recent Labs Lab Oct 01, 2016 1014  WBC 4.5  NEUTROABS 2.3  HGB 10.2*  HCT 30.8*  MCV 96.2  PLT 243   Cardiac Enzymes:  Recent Labs Lab Oct 01, 2016 1014  TROPONINI 0.04*   Sepsis Labs:  Recent Labs Lab Oct 01, 2016 1014 Oct 01, 2016 1325  PROCALCITON 0.57  --   WBC 4.5  --   LATICACIDVEN 5.8* 4.7*    Procedures/Operations  Morphine infusion Comfort care order set used   Enora Trillo Feb 06, 2017, 4:15 PM

## 2016-07-24 NOTE — ED Notes (Signed)
Sitter placed at bedside due to patient pulling at mask and placing bair hugger over her face. Awaiting family arrival. Patient's status unchanged.

## 2016-07-24 NOTE — ED Notes (Signed)
Time of death called at [id number].

## 2016-07-24 NOTE — Progress Notes (Signed)
LCSW introduced myself to family and provided 1-1 support. Family was being supported by Bahamashaplain and family members were supporting their loved one. Provided assistance with ice, beverages and spoke to her son 1-1.   LCSW left after other family members arrived  to allow family the privacy they needed.  Consulted with ED RNs, no further needs.  Delta Air LinesClaudine Keltie Labell LCSW [phone]

## 2016-07-24 NOTE — Progress Notes (Signed)
ICU team was called to evaluate patient with severe and acute resp failure with shock and renal failure from pneumonia/CHF Patient using accessory muscles to breathe, increased WOB, family at bedside. AT this time, patient status is rapidly declining and at this time is actively dying. Patient with severe bradycardic, hypothermic with extreme resp distress and struggling to breathe with agonal respirations.   I have met with mother, step father, the son, and brother of the patient. They understand her grave prognosis and they understand the best course of action is for comfort care measures.   Patient is DNR/DNI, however, patient is in the process of dying. I have explained that patient has multiorgan failure and will not survive this admission  Will proceed with Comfort Care measures.    Patient/Family are satisfied with Plan of action and management. All questions answered  Corrin Parker, M.D.  [facility]  Medical Director [place] Director [facility] Cardio-Pulmonary Department

## 2016-07-24 NOTE — Progress Notes (Signed)
Pharmacy Antibiotic Note  Michelle Valencia is a 71 y.o. female admitted on 03/14/2017 with pneumonia.  Pharmacy has been consulted for vancomycin/zosyn dosing.  Plan: Vancomycin 1g given in ED @ 1046 will administer another 750 mg dose now to complete 1750 mg load for an expected peak of 31.2 mg/L and will check a random level tomorrow w/ am labs. Will initiate Zosyn 3.375g IV q12h per renal function. T1/2 40 hours ~ 48 hours or 2 days Ke 0.0171 CrCl 15.4 ml/min Scr 3.17 - patient appears to be in AKI secondary to hypovolemia.   Height: 5\' 4"  (162.6 cm) Weight: 150 lb (68 kg) IBW/kg (Calculated) : 54.7  Temp (24hrs), Avg:92.1 F (33.4 C), Min:92.1 F (33.4 C), Max:92.1 F (33.4 C)   Recent Labs Lab 01/27/2017 1014  WBC 4.5  CREATININE 3.17*  LATICACIDVEN 5.8*    Estimated Creatinine Clearance: 15.4 mL/min (by C-G formula based on SCr of 3.17 mg/dL (H)).    Allergies  Allergen Reactions  . Penicillins     Antimicrobials this admission: 2/23 Vanc >>  2/23 Zosyn >>   Dose adjustments this admission: Zosyn 3.375 g IV q12h per renal function < 20 ml/min  Microbiology results: 2/23 BCx: sent 2/23 UCx: sent   Thank you for allowing pharmacy to be a part of this patient's care.  Thomasene Rippleavid Abdelrahman Nair, PharmD, BCPS Clinical Pharmacist 03/14/2017

## 2016-07-24 NOTE — ED Notes (Signed)
[facility] was called and informed of patient's time of death.  Information was given to representative Felipa Ethobert Kiser.  Confirmed that the patient may go to the funeral home and a confirmation code was given that included: 07/16/2016-052.  Database administratorHouse Supervisor, attending Physician, and charge RN all notified.

## 2016-07-24 NOTE — ED Notes (Signed)
Patient's status unchanged other than patient seems slightly calmer. Patient started on Morphine drip and given 5mg  Morphine bolus from bag.

## 2016-07-24 NOTE — ED Provider Notes (Addendum)
[facility] Emergency Department Provider Note  ____________________________________________  Time seen: Approximately 10:09 AM  I have reviewed the triage vital signs and the nursing notes.   HISTORY  Chief Complaint Altered Mental Status  Level 5 caveat:  Portions of the history and physical were unable to be obtained due to the patient's acute illness and altered mental status   HPI Michelle Valencia is a 71 y.o. female sent to the ED due to declining mental status and pneumonia on chest x-ray was performed last night. Patient unable to provide any history. X-ray report from yesterday shows bilateral infiltrates concerning for pneumonia or ARDS or pulmonary edema but slightly nonspecific.     Past Medical History:  Diagnosis Date  . Adult failure to thrive   . Age related osteoporosis   . Anemia   . Anxiety   . Disorganized schizophrenia (HCC)   . Eating disorder   . Extrapyramidal and movement disorder   . Gastro-esophageal reflux disease without esophagitis   . Hyperlipidemia   . Hypertension   . Hypothyroidism   . Insomnia   . Major depressive disorder   . Neuroleptic-induced Parkinsonism (HCC)      There are no active problems to display for this patient.    History reviewed. No pertinent surgical history.   Prior to Admission medications   Medication Sig Start Date End Date Taking? Authorizing Provider  acetaminophen (TYLENOL) 325 MG tablet Take 650 mg by mouth every 4 (four) hours as needed.   Yes Historical Provider, MD  amantadine (SYMMETREL) 100 MG capsule Take 100 mg by mouth 2 (two) times daily.   Yes Historical Provider, MD  Calcium Carbonate-Vitamin D3 (CALCIUM 600-D) 600-400 MG-UNIT TABS Take 1 tablet by mouth 2 (two) times daily.   Yes Historical Provider, MD  divalproex (DEPAKOTE ER) 500 MG 24 hr tablet Take 1,000 mg by mouth at bedtime.   Yes Historical Provider, MD  ferrous sulfate 325 (65 FE) MG tablet Take 325 mg by  mouth daily with breakfast.   Yes Historical Provider, MD  haloperidol (HALDOL) 2 MG tablet Take 4 mg by mouth 2 (two) times daily.   Yes Historical Provider, MD  haloperidol decanoate (HALDOL DECANOATE) 50 MG/ML injection Inject into the muscle every 28 (twenty-eight) days.   Yes Historical Provider, MD  LORazepam (ATIVAN) 0.5 MG tablet Take 0.5 mg by mouth 2 (two) times daily.   Yes Historical Provider, MD  Multiple Vitamin (MULTIVITAMIN) tablet Take 1 tablet by mouth daily.   Yes Historical Provider, MD  omeprazole (PRILOSEC) 20 MG capsule Take 20 mg by mouth daily.   Yes Historical Provider, MD  polyethylene glycol (MIRALAX / GLYCOLAX) packet Take 17 g by mouth 2 (two) times daily.   Yes Historical Provider, MD  polyvinyl alcohol (LIQUIFILM TEARS) 1.4 % ophthalmic solution Place 1 drop into both eyes every 4 (four) hours as needed for dry eyes.   Yes Historical Provider, MD  traZODone (DESYREL) 50 MG tablet Take 75 mg by mouth at bedtime.   Yes Historical Provider, MD     Allergies Penicillins   History reviewed. No pertinent family history.  Social History Social History  Substance Use Topics  . Smoking status: Unknown If Ever Smoked  . Smokeless tobacco: Never Used  . Alcohol use No    Review of Systems Unable to obtain due to altered mental status. ____________________________________________   PHYSICAL EXAM:  VITAL SIGNS: 71F, heart rate 40, oxygen saturation 89 on 6 L nasal cannula,  blood pressure 90/80.  Vital signs reviewed, nursing assessments reviewed.   Constitutional:   Stuporous. Respiratory distress. Eyes:   No scleral icterus.  PERRL.[facility]   Head:   Normocephalic and atraumatic.   Nose:   No congestion/rhinnorhea. No septal hematoma   Mouth/Throat:   Dry mucous membranes, no pharyngeal erythema. No peritonsillar mass.    Neck:   No stridor. No SubQ emphysema. No meningismus. Hematological/Lymphatic/Immunilogical:   No cervical  lymphadenopathy. Cardiovascular:   Bradycardia heart rate 40. Symmetric bilateral radial and DP pulses.  No murmurs.  Respiratory:   Tachypnea, bilateral crackles diffusely, increased work of breathing.. Gastrointestinal:   Soft and nontender. Non distended. There is no CVA tenderness.  No rebound, rigidity, or guarding. Genitourinary:   deferred Musculoskeletal:   Normal range of motion in all extremities. No joint effusions.  No lower extremity tenderness.  No edema. Neurologic:   Normal speech and language.  CN 2-10 normal. Motor grossly intact. No gross focal neurologic deficits are appreciated.  Skin:    Skin is warm, dry and intact. No rash noted.  No petechiae, purpura, or bullae. Poor skin turgor  ____________________________________________    LABS (pertinent positives/negatives) (all labs ordered are listed, but only abnormal results are displayed) Labs Reviewed  LACTIC ACID, PLASMA - Abnormal; Notable for the following:       Result Value   Lactic Acid, Venous 5.8 (*)    All other components within normal limits  COMPREHENSIVE METABOLIC PANEL - Abnormal; Notable for the following:    Sodium 132 (*)    Chloride 90 (*)    Glucose, Bld 132 (*)    BUN 96 (*)    Creatinine, Ser 3.17 (*)    Calcium 10.8 (*)    Total Protein 6.4 (*)    Albumin 2.7 (*)    AST 120 (*)    ALT 61 (*)    GFR calc non Af Amer 14 (*)    GFR calc Af Amer 16 (*)    Anion gap 19 (*)    All other components within normal limits  TROPONIN I - Abnormal; Notable for the following:    Troponin I 0.04 (*)    All other components within normal limits  CBC WITH DIFFERENTIAL/PLATELET - Abnormal; Notable for the following:    RBC 3.20 (*)    Hemoglobin 10.2 (*)    HCT 30.8 (*)    RDW 15.3 (*)    All other components within normal limits  PROTIME-INR - Abnormal; Notable for the following:    Prothrombin Time 15.9 (*)    All other components within normal limits  BLOOD GAS, VENOUS - Abnormal; Notable  for the following:    pO2, Ven 58.0 (*)    All other components within normal limits  CULTURE, BLOOD (ROUTINE X 2)  CULTURE, BLOOD (ROUTINE X 2)  URINE CULTURE  LIPASE, BLOOD  APTT  PROCALCITONIN  INFLUENZA PANEL BY PCR (TYPE A & B)  LACTIC ACID, PLASMA   ____________________________________________   EKG    ____________________________________________    RADIOLOGY  Dg Chest Port 1 View  Result Date: 08-04-16 CLINICAL DATA:  Hypoxia, bilateral lung crackles, altered mental status EXAM: PORTABLE CHEST 1 VIEW COMPARISON:  02/06/2011 FINDINGS: There is streaky airspace opacification bilaterally highly suspicious for bilateral pneumonia or pulmonary edema. Cardiomediastinal silhouette is unremarkable. IMPRESSION: Bilateral streaky airspace opacification and interstitial prominence highly suspicious for bilateral pneumonia or pulmonary edema. Clinical correlation is necessary. Electronically Signed   By: Lang Snow  Pop M.D.   On: 2016-12-22 10:33    ____________________________________________   PROCEDURES Procedures CRITICAL CARE Performed by: Scotty CourtSTAFFORD, Cowen Pesqueira   Total critical care time: 35 minutes  Critical care time was exclusive of separately billable procedures and treating other patients.  Critical care was necessary to treat or prevent imminent or life-threatening deterioration.  Critical care was time spent personally by me on the following activities: development of treatment plan with patient and/or surrogate as well as nursing, discussions with consultants, evaluation of patient's response to treatment, examination of patient, obtaining history from patient or surrogate, ordering and performing treatments and interventions, ordering and review of laboratory studies, ordering and review of radiographic studies, pulse oximetry and re-evaluation of patient's condition.  ____________________________________________   INITIAL IMPRESSION / ASSESSMENT AND PLAN / ED  COURSE  Pertinent labs & imaging results that were available during my care of the patient were reviewed by me and considered in my medical decision making (see chart for details).  Patient presents with hypoxia, bradycardia, ultimate status. Code sepsis initiated immediately upon initial assessment. IV Zosyn and vancomycin. IV saline boluses.     Clinical Course as of Jul 18 1214  Fri Jul 18, 2016  1143 Patient has had 1 L of saline infused. Heart rate remains 40. Map of 70, episodes of hypotension. Family member emergency contact number disconnected. Called friend emergency contact listed, spoke with her. She will relay a message to the patient's mother will call back to the ED. Trying to determine scope of care the patient would want including potential invasive procedures.  [PS]  1159 Discussed with patient's mother. She reports she is unable to gaze whether patient would want very aggressive treatment including central venous catheters. Given the persistent hypoxia, I think the patient is likely to expire from acute respiratory failure despite any other medical interventions. She is not a candidate for BiPAP due to her agitation and altered mental status. Therefore I'll hold off on further invasive procedures and continue IV fluid resuscitation. I did advise the mother that I expect her daughter will expire within the next day  [PS]    Clinical Course User Index [PS] Sharman CheekPhillip Jenice Leiner, MD     ----------------------------------------- 12:20 PM on 2016-12-22 -----------------------------------------  Is discussed with critical care doctor Kasa, will evaluate for admission to ICU.  ____________________________________________   FINAL CLINICAL IMPRESSION(S) / ED DIAGNOSES  Final diagnoses:  Delirium  Pneumonia of both lungs due to infectious organism, unspecified part of lung  Acute respiratory failure with hypoxia (HCC)  Sepsis, due to unspecified organism [facility](HCC)      New  Prescriptions   No medications on file     Portions of this note were generated with dragon dictation software. Dictation errors may occur despite best attempts at proofreading.    Sharman CheekPhillip Jilleen Essner, MD 10/01/2016 1220    ----------------------------------------- 7:24 AM on 08/03/2016 ----------------------------------------- Late entry note to add EKG interpretation  EKG performed on 2016-12-22 interpreted by me and considered in my treatment of the patient contemporaneously while in the emergency department Junctional escape rhythm, rate 44. Right axis, normal intervals. Normal QRS ST segments and T waves. No acute ischemic changes.    Sharman CheekPhillip Britain Anagnos, MD 08/03/16 [phone]

## 2016-07-24 NOTE — ED Notes (Signed)
Patient now with agonal breathing. Parents at bedside.

## 2016-07-24 NOTE — ED Triage Notes (Signed)
[facility] Chest xray was done last night and confirmed pneumonia. Pts family wanted her sent to ER due to declining status. Staff reports pt is normally confused but has become more altered.

## 2016-07-24 NOTE — Progress Notes (Signed)
Chaplain received a page about patient. It was an end of life situation. Chaplain prayed for patient and prayed with family members. After about 40 minutes, Chaplain was paged again for the same patient in Ed17. After withdrawing of the oxygen mask patient did not last long. Chaplain stayed with family for about an hour until the patient passed away.

## 2016-07-24 NOTE — ED Notes (Signed)
Family members at bedside, monitor turned to comfort care setting. Awaiting Morphine drip to be ready for pick up from pharmacy. Condolence basket given to family. No other needs at this time.

## 2016-07-24 NOTE — ED Notes (Signed)
Dr. Scotty CourtStafford aware of vitals and patient's deteriorating status. No new interventions ordered, as patient is DNR.

## 2016-07-24 NOTE — ED Notes (Signed)
Patient continues to have agonal breathing. Respirations have slowed. Pulse is currently in 60's, but thready. Family, this RN and chaplain at bedside.

## 2016-07-24 NOTE — ED Notes (Signed)
Patient family has given verbal permission for funeral service to pick up patient.  Informed them that their contact number is in the system for any further question if needed.

## 2016-07-24 NOTE — ED Notes (Signed)
Patient now being made comfort care per family's wish. Patient continues to be unable to comprehend her surroundings/situation. Patient remains very pale with bradycardia, hypoxia, and fairly constant hypotension.

## 2016-07-24 DEATH — deceased
